# Patient Record
Sex: Male | Born: 1955 | Race: Black or African American | Hispanic: No | Marital: Single | Smoking: Former smoker
Health system: Southern US, Community
[De-identification: ages and names within clinical notes are randomized; demographics above are authoritative.]

## PROBLEM LIST (undated history)

## (undated) DIAGNOSIS — R634 Abnormal weight loss: Secondary | ICD-10-CM

## (undated) DIAGNOSIS — R059 Cough, unspecified: Secondary | ICD-10-CM

## (undated) DIAGNOSIS — R32 Unspecified urinary incontinence: Secondary | ICD-10-CM

## (undated) DIAGNOSIS — F32A Depression, unspecified: Secondary | ICD-10-CM

## (undated) DIAGNOSIS — F329 Major depressive disorder, single episode, unspecified: Secondary | ICD-10-CM

## (undated) DIAGNOSIS — F419 Anxiety disorder, unspecified: Secondary | ICD-10-CM

## (undated) DIAGNOSIS — E1165 Type 2 diabetes mellitus with hyperglycemia: Secondary | ICD-10-CM

## (undated) DIAGNOSIS — E559 Vitamin D deficiency, unspecified: Secondary | ICD-10-CM

## (undated) DIAGNOSIS — M545 Low back pain, unspecified: Secondary | ICD-10-CM

## (undated) DIAGNOSIS — R55 Syncope and collapse: Secondary | ICD-10-CM

## (undated) DIAGNOSIS — I10 Essential (primary) hypertension: Secondary | ICD-10-CM

## (undated) DIAGNOSIS — R05 Cough: Secondary | ICD-10-CM

## (undated) DIAGNOSIS — M549 Dorsalgia, unspecified: Secondary | ICD-10-CM

## (undated) DIAGNOSIS — M1990 Unspecified osteoarthritis, unspecified site: Secondary | ICD-10-CM

## (undated) DIAGNOSIS — IMO0001 Reserved for inherently not codable concepts without codable children: Secondary | ICD-10-CM

## (undated) DIAGNOSIS — E785 Hyperlipidemia, unspecified: Secondary | ICD-10-CM

## (undated) HISTORY — DX: Unspecified urinary incontinence: R32

## (undated) HISTORY — DX: Major depressive disorder, single episode, unspecified: F32.9

## (undated) HISTORY — DX: Low back pain: M54.5

## (undated) HISTORY — DX: Cough: R05

## (undated) HISTORY — DX: Vitamin D deficiency, unspecified: E55.9

## (undated) HISTORY — DX: Low back pain, unspecified: M54.50

## (undated) HISTORY — DX: Hyperlipidemia, unspecified: E78.5

## (undated) HISTORY — DX: Dorsalgia, unspecified: M54.9

## (undated) HISTORY — DX: Unspecified osteoarthritis, unspecified site: M19.90

## (undated) HISTORY — DX: Anxiety disorder, unspecified: F41.9

## (undated) HISTORY — DX: Abnormal weight loss: R63.4

## (undated) HISTORY — DX: Syncope and collapse: R55

## (undated) HISTORY — DX: Essential (primary) hypertension: I10

## (undated) HISTORY — DX: Depression, unspecified: F32.A

## (undated) HISTORY — DX: Cough, unspecified: R05.9

## (undated) HISTORY — DX: Reserved for inherently not codable concepts without codable children: IMO0001

## (undated) HISTORY — DX: Type 2 diabetes mellitus with hyperglycemia: E11.65

---

## 2010-07-17 HISTORY — PX: CERVICAL FUSION: SHX112

## 2010-07-17 HISTORY — PX: KNEE ARTHROSCOPY: SUR90

## 2010-07-17 HISTORY — PX: LUMBAR DISC SURGERY: SHX700

## 2012-09-09 ENCOUNTER — Other Ambulatory Visit: Payer: Self-pay | Admitting: Nurse Practitioner

## 2012-09-09 ENCOUNTER — Encounter: Payer: Self-pay | Admitting: Internal Medicine

## 2012-09-09 DIAGNOSIS — F17200 Nicotine dependence, unspecified, uncomplicated: Secondary | ICD-10-CM

## 2012-09-10 ENCOUNTER — Other Ambulatory Visit: Payer: Self-pay | Admitting: Nurse Practitioner

## 2012-09-10 ENCOUNTER — Ambulatory Visit
Admission: RE | Admit: 2012-09-10 | Discharge: 2012-09-10 | Disposition: A | Discharge status: Home / Self Care | Payer: Medicare Other | Source: Ambulatory Visit | Attending: Nurse Practitioner | Admitting: Nurse Practitioner

## 2012-09-10 DIAGNOSIS — R05 Cough: Secondary | ICD-10-CM

## 2012-09-10 DIAGNOSIS — R52 Pain, unspecified: Secondary | ICD-10-CM

## 2012-09-10 DIAGNOSIS — F17200 Nicotine dependence, unspecified, uncomplicated: Secondary | ICD-10-CM

## 2012-10-03 ENCOUNTER — Ambulatory Visit (AMBULATORY_SURGERY_CENTER): Payer: Medicare Other | Admitting: *Deleted

## 2012-10-03 VITALS — Ht 74.0 in | Wt 264.4 lb

## 2012-10-03 DIAGNOSIS — Z1211 Encounter for screening for malignant neoplasm of colon: Secondary | ICD-10-CM

## 2012-10-03 MED ORDER — MOVIPREP 100 G PO SOLR: ORAL | Status: DC

## 2012-10-14 ENCOUNTER — Encounter: Payer: Self-pay | Admitting: Pharmacotherapy

## 2012-10-14 ENCOUNTER — Ambulatory Visit (INDEPENDENT_AMBULATORY_CARE_PROVIDER_SITE_OTHER): Payer: Medicare Other | Admitting: Pharmacotherapy

## 2012-10-14 VITALS — BP 110/72 | HR 91 | Temp 98.1°F | Resp 18 | Ht 74.5 in | Wt 262.0 lb

## 2012-10-14 DIAGNOSIS — I10 Essential (primary) hypertension: Secondary | ICD-10-CM

## 2012-10-14 DIAGNOSIS — E119 Type 2 diabetes mellitus without complications: Secondary | ICD-10-CM

## 2012-10-14 DIAGNOSIS — E1149 Type 2 diabetes mellitus with other diabetic neurological complication: Secondary | ICD-10-CM | POA: Insufficient documentation

## 2012-10-14 NOTE — Progress Notes (Signed)
  Subjective:    James Fitzpatrick is a 57 y.o. male who presents for follow-up of Type 2 diabetes mellitus.   He has not been self-monitoring blood glucose - no lancets. He has been cutting out simple sugars and cutting portion sizes.  He has a weakness for rice and pasta.  Eats rice and pasta every day. He often skips meals. No regular exercise. He does have an active job. He does have numbness and tingling in feet - but this it from sciatic nerve.  He checks feet daily Some blurry vision.  Eye exam was last week. Some nocturia. Having some polydipsia and dry mouth  He is currently taking Metformin 1000mg  AM, 500mg  PM.  No GI distress.  Review of Systems A comprehensive review of systems was negative.    Objective:    BP 110/72  Pulse 91  Temp(Src) 98.1 F (36.7 C) (Oral)  Resp 18  Ht 6' 2.5" (1.892 m)  Wt 262 lb (118.842 kg)  BMI 33.2 kg/m2  SpO2 97%  General:  alert, cooperative and appears stated age  Oropharynx: lips, mucosa, and tongue normal; teeth and gums normal   Eyes:  conjunctivae/corneas clear. PERRL, EOM's intact. Fundi benign.           Lung: clear to auscultation bilaterally  Heart:  regular rate and rhythm, S1, S2 normal, no murmur, click, rub or gallop        Skin: warm and dry, no hyperpigmentation, vitiligo, or suspicious lesions   Lab Review No results found for this basename: GLUCOSE, SODIUM, POTASSIUM, CHLORIDE, CO2, BUN, CREATININE  A1C 6.9% in January 2014   Assessment:    Diabetes Mellitus type II, under fair control.  HTN - BP at goal <140/80   Plan:   1.  Continue Metformin 1000mg  AM, 500mg  PM 2.  Counseled on complications of uncontrolled DM 3.  Counseled on insulin resistance 4.  Counseled on meal planning, serving size and carbohydrate counting. 5.  Counseled on benefit of routine exercise.  Goal is 30-45 minutes 5 x week. 6.  Counseled on foot care. 7.  Needs to self monitor blood glucose.  RX for lancets called to [facility] for  patient. 8.  BP at goal <140/80.  Continue Lisinopril and HCTZ. 9.  RTC in 6 weeks with blood glucose meter to reassess metformin effectiveness.

## 2012-10-17 ENCOUNTER — Ambulatory Visit (AMBULATORY_SURGERY_CENTER): Payer: Medicare Other | Admitting: Internal Medicine

## 2012-10-17 ENCOUNTER — Encounter: Payer: Self-pay | Admitting: Internal Medicine

## 2012-10-17 ENCOUNTER — Other Ambulatory Visit: Payer: Self-pay | Admitting: Internal Medicine

## 2012-10-17 VITALS — BP 120/77 | HR 59 | Temp 96.0°F | Resp 20 | Ht 74.0 in | Wt 264.0 lb

## 2012-10-17 DIAGNOSIS — K635 Polyp of colon: Secondary | ICD-10-CM

## 2012-10-17 DIAGNOSIS — K633 Ulcer of intestine: Secondary | ICD-10-CM

## 2012-10-17 DIAGNOSIS — Z1211 Encounter for screening for malignant neoplasm of colon: Secondary | ICD-10-CM

## 2012-10-17 DIAGNOSIS — D1330 Benign neoplasm of unspecified part of small intestine: Secondary | ICD-10-CM

## 2012-10-17 DIAGNOSIS — K529 Noninfective gastroenteritis and colitis, unspecified: Secondary | ICD-10-CM

## 2012-10-17 DIAGNOSIS — D126 Benign neoplasm of colon, unspecified: Secondary | ICD-10-CM

## 2012-10-17 MED ORDER — SODIUM CHLORIDE 0.9 % IV SOLN: 500.0000 mL | INTRAVENOUS | Status: DC

## 2012-10-17 NOTE — Patient Instructions (Addendum)

## 2012-10-17 NOTE — Progress Notes (Signed)
Lidocaine-40mg IV prior to Propofol InductionPropofol given over incremental dosages 

## 2012-10-17 NOTE — Progress Notes (Signed)
Called to room to assist during endoscopic procedure.  Patient ID and intended procedure confirmed with present staff. Received instructions for my participation in the procedure from the performing physician.  

## 2012-10-17 NOTE — Op Note (Signed)
[facility] [address] [place], [address]   COLONOSCOPY PROCEDURE REPORT  PATIENT: James Fitzpatrick, James Fitzpatrick  MR#: [id number] BIRTHDATE: 1956/02/19 , 56  yrs. old GENDER: Male ENDOSCOPIST: Beverley Fiedler, MD REFERRED BY: Candelaria Celeste, [facility] PROCEDURE DATE:  10/17/2012 PROCEDURE:   Colonoscopy with biopsy and Colonoscopy with snare polypectomy ASA CLASS:   Class II INDICATIONS:elevated risk screening, Patient's immediate family history of colon cancer, and Last colonoscopy performed greater than 5 yrs ago. MEDICATIONS: MAC sedation, administered by CRNA and propofol (Diprivan) 250mg  IV  DESCRIPTION OF PROCEDURE:   After the risks benefits and alternatives of the procedure were thoroughly explained, informed consent was obtained.  A digital rectal exam revealed no rectal mass.   The LB CF-[id number]  endoscope was introduced through the anus and advanced to the terminal ileum which was intubated for a short distance. No adverse events experienced.   The quality of the prep was good, using MoviPrep  The instrument was then slowly withdrawn as the colon was fully examined.   COLON FINDINGS: Two small ulcers without surrounding ileitis were found in the terminal ileum.  Multiple biopsies were performed using cold forceps.   Two sessile polyps ranging between 3-58mm in size were found in the rectosigmoid colon.  Polypectomy was performed using cold snare.  All resections were complete and all polyp tissue was completely retrieved.   Mild diverticulosis was noted in the descending colon and sigmoid colon.   The colon mucosa was otherwise normal.  Retroflexed views revealed no abnormalities. The time to cecum=3 minutes 55 seconds.  Withdrawal time=13 minutes 42 seconds.  The scope was withdrawn and the procedure completed. COMPLICATIONS: There were no complications.  ENDOSCOPIC IMPRESSION: 1.   Two small ulcers in the terminal ileum; multiple biopsies were performed  using cold forceps 2.   Two sessile polyps ranging between 3-49mm in size were found in the rectosigmoid colon; Polypectomy was performed using cold snare 3.   Mild diverticulosis was noted in the descending colon and sigmoid colon 4.   The colon mucosa was otherwise normal  RECOMMENDATIONS: 1.  Await pathology results 2.  High fiber diet 3.  Avoid NSAIDS 4.  Given your significant family history of colon cancer, you should have a repeat colonoscopy in 5 years   eSigned:  Beverley Fiedler, MD 10/17/2012 12:58 PM           cc: The Patient

## 2012-10-17 NOTE — Progress Notes (Signed)
Patient did not experience any of the following events: a burn prior to discharge; a fall within the facility; wrong site/side/patient/procedure/implant event; or a hospital transfer or hospital admission upon discharge from the facility. (G8907) Patient did not have preoperative order for IV antibiotic SSI prophylaxis. (G8918)  

## 2012-10-18 ENCOUNTER — Telehealth: Payer: Self-pay | Admitting: *Deleted

## 2012-10-18 NOTE — Telephone Encounter (Signed)
  Follow up Call-  Call back number 10/17/2012  Post procedure Call Back phone  # [phone]  Permission to leave phone message Yes     Patient questions:  Do you have a fever, pain , or abdominal swelling? no Pain Score  0 *  Have you tolerated food without any problems? yes  Have you been able to return to your normal activities? yes  Do you have any questions about your discharge instructions: Diet   no Medications  no Follow up visit  no  Do you have questions or concerns about your Care? no  Actions: * If pain score is 4 or above: No action needed, pain <4.

## 2012-10-23 ENCOUNTER — Encounter: Payer: Self-pay | Admitting: Internal Medicine

## 2012-11-13 ENCOUNTER — Encounter: Payer: Self-pay | Admitting: *Deleted

## 2012-11-14 ENCOUNTER — Encounter: Payer: Self-pay | Admitting: Nurse Practitioner

## 2012-11-14 ENCOUNTER — Ambulatory Visit (INDEPENDENT_AMBULATORY_CARE_PROVIDER_SITE_OTHER): Payer: Medicare Other | Admitting: Nurse Practitioner

## 2012-11-14 VITALS — BP 134/88 | HR 71 | Temp 97.4°F | Resp 15 | Ht 74.0 in | Wt 263.2 lb

## 2012-11-14 DIAGNOSIS — E1142 Type 2 diabetes mellitus with diabetic polyneuropathy: Secondary | ICD-10-CM

## 2012-11-14 DIAGNOSIS — M5430 Sciatica, unspecified side: Secondary | ICD-10-CM | POA: Insufficient documentation

## 2012-11-14 DIAGNOSIS — L98499 Non-pressure chronic ulcer of skin of other sites with unspecified severity: Secondary | ICD-10-CM

## 2012-11-14 DIAGNOSIS — M542 Cervicalgia: Secondary | ICD-10-CM

## 2012-11-14 DIAGNOSIS — E1140 Type 2 diabetes mellitus with diabetic neuropathy, unspecified: Secondary | ICD-10-CM | POA: Insufficient documentation

## 2012-11-14 DIAGNOSIS — M5431 Sciatica, right side: Secondary | ICD-10-CM

## 2012-11-14 DIAGNOSIS — E1149 Type 2 diabetes mellitus with other diabetic neurological complication: Secondary | ICD-10-CM

## 2012-11-14 MED ORDER — CYCLOBENZAPRINE HCL 5 MG PO TABS: 5.0000 mg | ORAL_TABLET | Freq: Three times a day (TID) | ORAL | Status: DC | PRN

## 2012-11-14 MED ORDER — CYCLOBENZAPRINE HCL 10 MG PO TABS: 10.0000 mg | ORAL_TABLET | Freq: Three times a day (TID) | ORAL | Status: DC | PRN

## 2012-11-14 NOTE — Assessment & Plan Note (Signed)
Mostly likely muscular related. Will give Rx for flexeril which has worked well for him in the past; educated on using heat and to take aleve TID for 7 days

## 2012-11-14 NOTE — Assessment & Plan Note (Signed)
Stable at this time 

## 2012-11-14 NOTE — Progress Notes (Signed)
Patient ID: James Fitzpatrick, male   DOB: 1955-07-30, 57 y.o.   MRN: [id number]  No Known Allergies  Chief Complaint  Patient presents with  . Back Pain  . Neck Pain    HPI: Patient is a 57 y.o. male seen in the office today for worsening back and neck pain. Pt was doing overtime at work and reports he over did it. Uncomfortable at night and very stiff in the morning. Pain on left side of neck and lower back bilaterally and shooting pains down his legs. Have to rest while at work due to the pain. Has not taken any medication for this   Also reports increased pain in his left index finger. He has a painful raised bump and opened the area up over a month ago. No drainage or heat but occasionally will get swollen and is always painful.  Review of Systems:  Review of Systems  Constitutional: Negative for fever, chills and weight loss.  HENT: Positive for neck pain.   Respiratory: Negative for shortness of breath.   Cardiovascular: Negative for chest pain.  Gastrointestinal: Negative for heartburn, nausea, vomiting, abdominal pain, diarrhea and constipation.  Genitourinary: Negative for dysuria, urgency and frequency.  Musculoskeletal: Positive for myalgias and back pain.  Skin: Negative for itching and rash.  Neurological: Negative for dizziness, weakness and headaches.     Past Medical History  Diagnosis Date  . Diabetes   . Hypertension   . Depression   . Arthritis   . Hyperlipidemia   . Pain in back   . Unspecified vitamin D deficiency   . Anxiety   . Syncope and collapse   . Cough   . Unspecified urinary incontinence    Past Surgical History  Procedure Laterality Date  . Lumbar disc surgery  2012  . Cervical fusion  2012  . Knee arthroscopy  2012    left   Social History:   reports that he has been smoking Cigarettes.  He has been smoking about 1.00 pack per day. He has never used smokeless tobacco. He reports that he does not drink alcohol or use illicit  drugs.  Family History  Problem Relation Age of Onset  . Colon cancer Father 68    Medications: Patient's Medications  New Prescriptions   CYCLOBENZAPRINE (FLEXERIL) 10 MG TABLET    Take 1 tablet (10 mg total) by mouth 3 (three) times daily as needed for muscle spasms.  Previous Medications   ACETAMINOPHEN-CODEINE (TYLENOL #3) 300-30 MG PER TABLET    Take 1 tablet by mouth every 4 (four) hours as needed for pain.   CITALOPRAM (CELEXA) 20 MG TABLET    Take 20 mg by mouth daily.   HYDROCHLOROTHIAZIDE (HYDRODIURIL) 25 MG TABLET    Take 25 mg by mouth daily.   LISINOPRIL (PRINIVIL,ZESTRIL) 20 MG TABLET    Take 20 mg by mouth daily.   METFORMIN HCL PO    Take by mouth. Takes 1000 mg in morning and 500 mg in the evening   MULTIPLE VITAMINS-MINERALS (MULTIVITAMIN PO)    Take by mouth daily.  Modified Medications   No medications on file  Discontinued Medications   MOVIPREP 100 G SOLR         Physical Exam:  Filed Vitals:   11/14/12 0903  BP: 134/88  Pulse: 71  Temp: 97.4 F (36.3 C)  TempSrc: Oral  Resp: 15  Height: 6\' 2"  (1.88 m)  Weight: 263 lb 3.2 oz (119.387 kg)    Physical Exam  Constitutional: He is well-developed, well-nourished, and in no distress. No distress.  HENT:  Head: Normocephalic and atraumatic.  Eyes: Conjunctivae and EOM are normal. Pupils are equal, round, and reactive to light.  Neck: Normal range of motion. Neck supple.  Cardiovascular: Normal rate, regular rhythm and normal heart sounds.   Pulmonary/Chest: Effort normal and breath sounds normal.  Abdominal: Soft. Bowel sounds are normal.  Musculoskeletal: He exhibits no edema.       Cervical back: He exhibits tenderness. He exhibits no bony tenderness, no swelling and no edema.  Tender along the left side of the cervical spine- tender to pull arms up above head but able to move all extremities without limited ROM  Neurological: He is alert.  Reports decrease sensation in lower extremities to touch    Skin: Skin is warm and dry. He is not diaphoretic.    Assessment/Plan Type II or unspecified type diabetes mellitus with neurological manifestations, not stated as uncontrolled(250.60) Will check labs today- reports he is only taking metformin 1000mg  in the am- may need to go up on this if A1c is worse.   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 Will give Rx for flexeril which has worked well for him in the past; educated on using heat and to take aleve TID for 7 days   Sciatica Will treat with flexeril, heat and aleve to follow up if no improvement or pain getting worse   ulceration of finger- due to area being dark in color painful and being on the hand will send to wound care for evaluation and treatment    Labs/tests ordered  Aic, BMp

## 2012-11-14 NOTE — Assessment & Plan Note (Signed)
Will treat with flexeril, heat and aleve to follow up if no improvement or pain getting worse

## 2012-11-14 NOTE — Assessment & Plan Note (Signed)
Will check labs today- reports he is only taking metformin 1000mg  in the am- may need to go up on this if A1c is worse.

## 2012-11-14 NOTE — Patient Instructions (Addendum)
Take aleve three time daily for 1 week Use heating pad to effected areas  Will send to wound care to evaluate finger  Take ranitidine 150mg  twice daily (OTC)   Sciatica Sciatica is pain, weakness, numbness, or tingling along the path of the sciatic nerve. The nerve starts in the lower back and runs down the back of each leg. The nerve controls the muscles in the lower leg and in the back of the knee, while also providing sensation to the back of the thigh, lower leg, and the sole of your foot. Sciatica is a symptom of another medical condition. For instance, nerve damage or certain conditions, such as a herniated disk or bone spur on the spine, pinch or put pressure on the sciatic nerve. This causes the pain, weakness, or other sensations normally associated with sciatica. Generally, sciatica only affects one side of the body. CAUSES   Herniated or slipped disc.  Degenerative disk disease.  A pain disorder involving the narrow muscle in the buttocks (piriformis syndrome).  Pelvic injury or fracture.  Pregnancy.  Tumor (rare). SYMPTOMS  Symptoms can vary from mild to very severe. The symptoms usually travel from the low back to the buttocks and down the back of the leg. Symptoms can include:  Mild tingling or dull aches in the lower back, leg, or hip.  Numbness in the back of the calf or sole of the foot.  Burning sensations in the lower back, leg, or hip.  Sharp pains in the lower back, leg, or hip.  Leg weakness.  Severe back pain inhibiting movement. These symptoms may get worse with coughing, sneezing, laughing, or prolonged sitting or standing. Also, being overweight may worsen symptoms. DIAGNOSIS  Your caregiver will perform a physical exam to look for common symptoms of sciatica. He or she may ask you to do certain movements or activities that would trigger sciatic nerve pain. Other tests may be performed to find the cause of the sciatica. These may include:  Blood  tests.  X-rays.  Imaging tests, such as an MRI or CT scan. TREATMENT  Treatment is directed at the cause of the sciatic pain. Sometimes, treatment is not necessary and the pain and discomfort goes away on its own. If treatment is needed, your caregiver may suggest:  Over-the-counter medicines to relieve pain.  Prescription medicines, such as anti-inflammatory medicine, muscle relaxants, or narcotics.  Applying heat or ice to the painful area.  Steroid injections to lessen pain, irritation, and inflammation around the nerve.  Reducing activity during periods of pain.  Exercising and stretching to strengthen your abdomen and improve flexibility of your spine. Your caregiver may suggest losing weight if the extra weight makes the back pain worse.  Physical therapy.  Surgery to eliminate what is pressing or pinching the nerve, such as a bone spur or part of a herniated disk. HOME CARE INSTRUCTIONS   Only take over-the-counter or prescription medicines for pain or discomfort as directed by your caregiver.  Apply ice to the affected area for 20 minutes, 3 4 times a day for the first 48 72 hours. Then try heat in the same way.  Exercise, stretch, or perform your usual activities if these do not aggravate your pain.  Attend physical therapy sessions as directed by your caregiver.  Keep all follow-up appointments as directed by your caregiver.  Do not wear high heels or shoes that do not provide proper support.  Check your mattress to see if it is too soft. A firm  mattress may lessen your pain and discomfort. SEEK IMMEDIATE MEDICAL CARE IF:   You lose control of your bowel or bladder (incontinence).  You have increasing weakness in the lower back, pelvis, buttocks, or legs.  You have redness or swelling of your back.  You have a burning sensation when you urinate.  You have pain that gets worse when you lie down or awakens you at night.  Your pain is worse than you have  experienced in the past.  Your pain is lasting longer than 4 weeks.  You are suddenly losing weight without reason. MAKE SURE YOU:  Understand these instructions.  Will watch your condition.  Will get help right away if you are not doing well or get worse. Document Released: 06/27/2001 Document Revised: 01/02/2012 Document Reviewed: 11/12/2011 [facility] Patient Information 2013 [place], [place].

## 2012-11-15 LAB — BASIC METABOLIC PANEL
BUN: 15 mg/dL (ref 6–24)
CO2: 24 mmol/L (ref 19–28)
Calcium: 10.5 mg/dL — ABNORMAL HIGH (ref 8.7–10.2)
Creatinine, Ser: 1.29 mg/dL — ABNORMAL HIGH (ref 0.76–1.27)
GFR calc non Af Amer: 62 mL/min/{1.73_m2} (ref >59)
Glucose: 105 mg/dL — ABNORMAL HIGH (ref 65–99)
Sodium: 136 mmol/L (ref 134–144)

## 2012-11-15 LAB — HEMOGLOBIN A1C: Hgb A1c MFr Bld: 7 % — ABNORMAL HIGH (ref 4.8–5.6)

## 2012-12-02 ENCOUNTER — Encounter (HOSPITAL_BASED_OUTPATIENT_CLINIC_OR_DEPARTMENT_OTHER): Payer: Medicare Other | Attending: Plastic Surgery

## 2012-12-02 ENCOUNTER — Other Ambulatory Visit: Payer: Self-pay | Admitting: Plastic Surgery

## 2012-12-02 DIAGNOSIS — M129 Arthropathy, unspecified: Secondary | ICD-10-CM | POA: Insufficient documentation

## 2012-12-02 DIAGNOSIS — R229 Localized swelling, mass and lump, unspecified: Secondary | ICD-10-CM | POA: Insufficient documentation

## 2012-12-02 DIAGNOSIS — L608 Other nail disorders: Secondary | ICD-10-CM | POA: Insufficient documentation

## 2012-12-02 DIAGNOSIS — B079 Viral wart, unspecified: Secondary | ICD-10-CM | POA: Insufficient documentation

## 2012-12-02 DIAGNOSIS — E785 Hyperlipidemia, unspecified: Secondary | ICD-10-CM | POA: Insufficient documentation

## 2012-12-02 DIAGNOSIS — I10 Essential (primary) hypertension: Secondary | ICD-10-CM | POA: Insufficient documentation

## 2012-12-02 DIAGNOSIS — E119 Type 2 diabetes mellitus without complications: Secondary | ICD-10-CM | POA: Insufficient documentation

## 2012-12-03 ENCOUNTER — Encounter: Payer: Self-pay | Admitting: *Deleted

## 2012-12-03 NOTE — Progress Notes (Signed)
[facility]  NAME:  James Fitzpatrick, James Fitzpatrick              ACCOUNT NO.:  [id number]  MEDICAL RECORD NO.:  [id number]      DATE OF BIRTH:  1955/09/22  PHYSICIAN:  Wayland Denis, DO       VISIT DATE:  12/02/2012                                  OFFICE VISIT   HISTORY OF PRESENT ILLNESS:  The patient is a 57 year old gentleman, who is here for evaluation of his right index finger.  He states that he has had wound in the area without it improving over the past 6 months.  He is not sure what happened and why he got it.  He has no associated events.  He has poked and fiddled  with it himself, which likely did not help.  Otherwise, nothing seems to be making it better.  PAST MEDICAL HISTORY:  Diabetes, hypertension, depression, arthritis, hyperlipidemia, back pain, vitamin D deficiency, anxiety, syncope, cough, and urinary incontinence.  PAST SURGICAL HISTORY:  Left lumbar disk surgery, cervical fusion, and knee arthroscopy of the three surgical procedures he has had.  SOCIAL HISTORY:  He is a smoker about a pack a day.  He denies any alcohol or illicit drugs.  He lives at home.  FAMILY HISTORY:  Positive for colon cancer.  REVIEW OF SYSTEMS:  He denies any was significant change in his vision. He has trouble keeping his blood sugars under control.  He denies any shortness of breath, chest pain, blood in his urine.  Skin; several skin issues and the left wrist nodule on the dorsal aspect.  PHYSICAL EXAMINATION:  VITAL SIGNS:  He is alert, oriented, cooperative, not in any acute distress.  He is pleasant. HEENT:  His pupils are equal.  Extraocular muscles are intact. NECK:  No cervical lymphadenopathy. LUNGS:  His breathing is unlabored. HEART:  His pulses are strong.  ASSESSMENT AND PLAN:  He has discoloration on the nail bed from proximal to distal in the central portion of the right long finger.  This is concerning for a melanoma.  He has got good capillary refill and  his fingers are warm.  The index finger on the palmar aspect has a 3-4 mm opening in the skin with some discoloration, which is concerning for melanoma as well.  On the left wrist, he has a 2-cm nodule that looks like a ganglion cyst that has been growing in the past 6 months.  All of these are concerning, he will be referred to Dr. Teressa Senter for the left ganglion cyst and the right long finger discoloration for which a biopsy is recommended.  A biopsy was done of the index finger, 3 mm with a ring block under sterile technique.  Superficial and deep margins were sent. The patient is to use triple antibiotic ointment and keep his arm elevated and follow up in 2 weeks.     Wayland Denis, DO     CS/MEDQ  D:  12/02/2012  T:  12/03/2012  Job:  [id number]

## 2012-12-05 ENCOUNTER — Ambulatory Visit: Payer: Self-pay | Admitting: Nurse Practitioner

## 2012-12-12 ENCOUNTER — Ambulatory Visit: Payer: Medicare Other | Admitting: Nurse Practitioner

## 2012-12-20 ENCOUNTER — Encounter: Payer: Self-pay | Admitting: *Deleted

## 2012-12-23 ENCOUNTER — Ambulatory Visit (INDEPENDENT_AMBULATORY_CARE_PROVIDER_SITE_OTHER): Payer: Medicare Other | Admitting: Nurse Practitioner

## 2012-12-23 ENCOUNTER — Encounter (HOSPITAL_BASED_OUTPATIENT_CLINIC_OR_DEPARTMENT_OTHER): Payer: Medicare Other | Attending: Plastic Surgery

## 2012-12-23 ENCOUNTER — Encounter: Payer: Self-pay | Admitting: Nurse Practitioner

## 2012-12-23 VITALS — BP 128/86 | HR 100 | Temp 98.1°F | Resp 15 | Ht 74.0 in | Wt 265.4 lb

## 2012-12-23 DIAGNOSIS — M5430 Sciatica, unspecified side: Secondary | ICD-10-CM

## 2012-12-23 DIAGNOSIS — E1142 Type 2 diabetes mellitus with diabetic polyneuropathy: Secondary | ICD-10-CM

## 2012-12-23 DIAGNOSIS — L98499 Non-pressure chronic ulcer of skin of other sites with unspecified severity: Secondary | ICD-10-CM

## 2012-12-23 DIAGNOSIS — E1140 Type 2 diabetes mellitus with diabetic neuropathy, unspecified: Secondary | ICD-10-CM

## 2012-12-23 DIAGNOSIS — M5431 Sciatica, right side: Secondary | ICD-10-CM

## 2012-12-23 DIAGNOSIS — E785 Hyperlipidemia, unspecified: Secondary | ICD-10-CM

## 2012-12-23 DIAGNOSIS — E1149 Type 2 diabetes mellitus with other diabetic neurological complication: Secondary | ICD-10-CM

## 2012-12-23 MED ORDER — GABAPENTIN 100 MG PO CAPS: 100.0000 mg | ORAL_CAPSULE | Freq: Four times a day (QID) | ORAL | Status: AC

## 2012-12-23 MED ORDER — METFORMIN HCL 1000 MG PO TABS: 1000.0000 mg | ORAL_TABLET | Freq: Two times a day (BID) | ORAL | Status: AC

## 2012-12-23 MED ORDER — CYCLOBENZAPRINE HCL 10 MG PO TABS: 10.0000 mg | ORAL_TABLET | Freq: Three times a day (TID) | ORAL | Status: DC | PRN

## 2012-12-23 NOTE — Patient Instructions (Addendum)
Please make appt in 2 months with Dr Glade Lloyd Mr Tagliaferro will need to come in for Lab work before appt

## 2012-12-23 NOTE — Progress Notes (Signed)
Patient ID: James Fitzpatrick, male   DOB: 08/22/1955, 57 y.o.   MRN: [id number]   No Known Allergies  Chief Complaint  Patient presents with  . Medical Managment of Chronic Issues    HPI: Patient is a 57 y.o. male seen in the office today for follow up Taking flexeril which significantly helps pain but is only taking this at night after he has worked all day- he has now gone from part time to full time at work and now neuropathy is worse  Ulcer on finger still there- does not notice it as much now- has follow up with wound care center  Review of Systems:  Review of Systems  Constitutional: Negative for fever, chills and malaise/fatigue.  Respiratory: Negative for cough.   Cardiovascular: Negative for chest pain, palpitations and leg swelling.  Gastrointestinal: Negative for abdominal pain, diarrhea and constipation.  Genitourinary: Negative for dysuria, urgency and frequency.  Musculoskeletal: Positive for back pain.  Skin: Negative.   Neurological: Positive for tingling (neuropathy in legs worse). Negative for weakness and headaches.  Psychiatric/Behavioral: Negative for depression. The patient is not nervous/anxious and does not have insomnia.      Past Medical History  Diagnosis Date  . Type II or unspecified type diabetes mellitus without mention of complication, uncontrolled   . Hypertension   . Depression   . Arthritis   . Hyperlipidemia   . Pain in back   . Unspecified vitamin D deficiency   . Anxiety   . Syncope and collapse   . Cough   . Unspecified urinary incontinence   . Loss of weight   . Unspecified vitamin D deficiency   . Major depressive disorder, single episode, unspecified   . Lumbago   . Loss of weight   . Type II or unspecified type diabetes mellitus without mention of complication, not stated as uncontrolled    Past Surgical History  Procedure Laterality Date  . Lumbar disc surgery  2012  . Cervical fusion  2012  . Knee arthroscopy  2012    left    Social History:   reports that he has been smoking Cigarettes.  He has been smoking about 1.00 pack per day. He has never used smokeless tobacco. He reports that he does not drink alcohol or use illicit drugs.  Family History  Problem Relation Age of Onset  . Colon cancer Father 44  . Stroke Mother     Medications: Patient's Medications  New Prescriptions   No medications on file  Previous Medications   ACETAMINOPHEN-CODEINE (TYLENOL #3) 300-30 MG PER TABLET    Take 1 tablet by mouth every 4 (four) hours as needed for pain.   CITALOPRAM (CELEXA) 20 MG TABLET    Take 20 mg by mouth daily.   CYCLOBENZAPRINE (FLEXERIL) 10 MG TABLET    Take 10 mg by mouth 3 (three) times daily as needed for muscle spasms.   ERGOCALCIFEROL (VITAMIN D2) 50000 UNITS CAPSULE    Take 50,000 Units by mouth once a week. Take one tablet once a week for vitamin d supplement   GLUCOSE BLOOD TEST STRIP    1 each by Other route as needed for other. Use to check blood sugar twice a day Dx. 250.02   HYDROCHLOROTHIAZIDE (HYDRODIURIL) 25 MG TABLET    Take 25 mg by mouth daily.   LISINOPRIL (PRINIVIL,ZESTRIL) 10 MG TABLET    Take 10 mg by mouth daily. Take one tablet once a day for blood pressure   MULTIPLE VITAMINS-MINERALS (  CENTRUM SILVER PO)    Take by mouth. Take one tablet once a day  Modified Medications   No medications on file  Discontinued Medications   METFORMIN HCL PO    Take by mouth. Takes 1000 mg in morning and 500 mg in the evening     Physical Exam:  Filed Vitals:   12/23/12 1524  BP: 128/86  Pulse: 100  Temp: 98.1 F (36.7 C)  TempSrc: Oral  Resp: 15  Height: 6\' 2"  (1.88 m)  Weight: 265 lb 6.4 oz (120.385 kg)    Physical Exam  Constitutional: He is oriented to person, place, and time and well-developed, well-nourished, and in no distress. No distress.  Cardiovascular: Normal rate and normal heart sounds.   Pulmonary/Chest: Effort normal and breath sounds normal. No respiratory distress.   Abdominal: Soft. Bowel sounds are normal. He exhibits no distension. There is no tenderness.  Musculoskeletal: Normal range of motion. He exhibits no edema and no tenderness.  Neurological: He is alert and oriented to person, place, and time.  Skin: Skin is warm and dry. He is not diaphoretic.  Psychiatric: Affect normal.     Labs reviewed: Basic Metabolic Panel:  Recent Labs  91/47/82 1018  NA 136  K 4.4  CL 97  CO2 24  GLUCOSE 105*  BUN 15  CREATININE 1.29*  CALCIUM 10.5*    Assessment/Plan No problem-specific assessment & plan notes found for this encounter.    1.   Ulcer of finger, with unspecified severity 707.8     conts to follow up with wound care   2.   Type II or unspecified type diabetes mellitus with neurological manifestations, not stated as uncontrolled(250.60) 250.60     Will get AIC and CMP   3.   Sciatica, right 724.3     stable; continue current regimen.   4.   Neuropathy in diabetes 250.60 ...     Will add gabapentin- to take taking at bedtime and then may increase to three times daily  Also if needed may take 2 tablets at night to help with neuropathy    5.   Other and unspecified hyperlipidemia   Will follow up lipids

## 2012-12-24 ENCOUNTER — Other Ambulatory Visit: Payer: Medicare Other

## 2012-12-25 LAB — COMPREHENSIVE METABOLIC PANEL
ALT: 29 IU/L (ref 0–44)
AST: 25 IU/L (ref 0–40)
Alkaline Phosphatase: 72 IU/L (ref 39–117)
BUN/Creatinine Ratio: 8 — ABNORMAL LOW (ref 9–20)
Chloride: 100 mmol/L (ref 97–108)
GFR calc Af Amer: 70 mL/min/{1.73_m2} (ref >59)
Potassium: 3.9 mmol/L (ref 3.5–5.2)
Sodium: 139 mmol/L (ref 134–144)
Total Bilirubin: 0.5 mg/dL (ref 0.0–1.2)

## 2012-12-25 LAB — LIPID PANEL
Cholesterol, Total: 220 mg/dL — ABNORMAL HIGH (ref 100–199)
HDL: 43 mg/dL (ref >39)
LDL Calculated: 158 mg/dL — ABNORMAL HIGH (ref 0–99)
VLDL Cholesterol Cal: 19 mg/dL (ref 5–40)

## 2013-01-31 ENCOUNTER — Telehealth: Payer: Self-pay | Admitting: Geriatric Medicine

## 2013-01-31 NOTE — Telephone Encounter (Signed)
Patient says the Gabapentin is not working for his back pain. Can he try something else? Please advise, thanks.

## 2013-01-31 NOTE — Telephone Encounter (Signed)
May increase to 2 tablets 3 times daily- if this is not effective may increase to 3 tablets 3 times daily-- will need follow up if this is not effective

## 2013-01-31 NOTE — Telephone Encounter (Signed)
Patient is aware. He will increase his dosage and call me back in a few days to let me know how he is feeling. I will refill his medication accordingly. He will run out due to the increase.

## 2013-02-24 ENCOUNTER — Other Ambulatory Visit: Payer: Medicare Other

## 2013-02-26 ENCOUNTER — Ambulatory Visit: Payer: Medicare Other | Admitting: Internal Medicine

## 2013-02-26 DIAGNOSIS — Z0289 Encounter for other administrative examinations: Secondary | ICD-10-CM

## 2013-04-02 ENCOUNTER — Encounter: Payer: Self-pay | Admitting: Nurse Practitioner

## 2013-04-02 ENCOUNTER — Ambulatory Visit (INDEPENDENT_AMBULATORY_CARE_PROVIDER_SITE_OTHER): Payer: Medicare Other | Admitting: Nurse Practitioner

## 2013-04-02 VITALS — BP 136/90 | HR 103 | Wt 269.4 lb

## 2013-04-02 DIAGNOSIS — I10 Essential (primary) hypertension: Secondary | ICD-10-CM

## 2013-04-02 DIAGNOSIS — E785 Hyperlipidemia, unspecified: Secondary | ICD-10-CM

## 2013-04-02 DIAGNOSIS — M545 Low back pain, unspecified: Secondary | ICD-10-CM

## 2013-04-02 DIAGNOSIS — M5430 Sciatica, unspecified side: Secondary | ICD-10-CM

## 2013-04-02 DIAGNOSIS — E1149 Type 2 diabetes mellitus with other diabetic neurological complication: Secondary | ICD-10-CM

## 2013-04-02 DIAGNOSIS — F411 Generalized anxiety disorder: Secondary | ICD-10-CM | POA: Insufficient documentation

## 2013-04-02 DIAGNOSIS — Z23 Encounter for immunization: Secondary | ICD-10-CM

## 2013-04-02 DIAGNOSIS — M5431 Sciatica, right side: Secondary | ICD-10-CM

## 2013-04-02 MED ORDER — LISINOPRIL 10 MG PO TABS: 10.0000 mg | ORAL_TABLET | Freq: Every day | ORAL | Status: AC

## 2013-04-02 MED ORDER — HYDROCHLOROTHIAZIDE 25 MG PO TABS: 25.0000 mg | ORAL_TABLET | Freq: Every day | ORAL | Status: AC

## 2013-04-02 MED ORDER — CYCLOBENZAPRINE HCL 10 MG PO TABS: 10.0000 mg | ORAL_TABLET | Freq: Three times a day (TID) | ORAL | Status: DC | PRN

## 2013-04-02 MED ORDER — ERGOCALCIFEROL 1.25 MG (50000 UT) PO CAPS: 50000.0000 [IU] | ORAL_CAPSULE | ORAL | Status: AC

## 2013-04-02 NOTE — Patient Instructions (Addendum)
Follow up in 6 weeks with fasting lab work before visit

## 2013-04-02 NOTE — Progress Notes (Signed)
Patient ID: James Fitzpatrick, male   DOB: 1956-03-05, 57 y.o.   MRN: [id number]   No Known Allergies  Chief Complaint  Patient presents with  . Back Problem    muscle spasms since Saturday, pain henders movement at times. Patient with history of back surgery   . Medication Management    out of all medications x 2 months EXCEPT metformin     HPI: Patient is a 57 y.o. male seen in the office today for back pain; pt has been off all medication except metformin for 2 months due to laps in insurance; he is here today to request refills; does not feel like this is new pain but worsening pt due to not having his medications; Last night he could not get out of bed due to medications-- previously on flexeril which helped  Denies loss of bowel or bladder, no shortness of breath, no chest pains Has excerebration in his sciatica  Review of Systems:  Review of Systems  Constitutional: Negative for fever, chills and malaise/fatigue.  Respiratory: Negative for cough and shortness of breath.   Cardiovascular: Negative for chest pain, palpitations and leg swelling.  Gastrointestinal: Negative for abdominal pain, diarrhea and constipation.  Genitourinary: Negative for dysuria, urgency and frequency.  Musculoskeletal: Positive for myalgias, back pain and joint pain.  Neurological: Positive for tingling (down right leg) and weakness (due to pain). Negative for dizziness, sensory change, focal weakness and headaches.     Past Medical History  Diagnosis Date  . Type II or unspecified type diabetes mellitus without mention of complication, uncontrolled   . Hypertension   . Depression   . Arthritis   . Hyperlipidemia   . Pain in back   . Unspecified vitamin D deficiency   . Anxiety   . Syncope and collapse   . Cough   . Unspecified urinary incontinence   . Loss of weight   . Unspecified vitamin D deficiency   . Major depressive disorder, single episode, unspecified   . Lumbago   . Loss of weight    . Type II or unspecified type diabetes mellitus without mention of complication, not stated as uncontrolled    Past Surgical History  Procedure Laterality Date  . Lumbar disc surgery  2012  . Cervical fusion  2012  . Knee arthroscopy  2012    left   Social History:   reports that he has been smoking Cigarettes.  He has been smoking about 1.00 pack per day. He has never used smokeless tobacco. He reports that he does not drink alcohol or use illicit drugs.  Family History  Problem Relation Age of Onset  . Colon cancer Father 62  . Stroke Mother     Medications: Patient's Medications  New Prescriptions   No medications on file  Previous Medications   ACETAMINOPHEN-CODEINE (TYLENOL #3) 300-30 MG PER TABLET    Take 1 tablet by mouth every 4 (four) hours as needed for pain.   CITALOPRAM (CELEXA) 20 MG TABLET    Take 20 mg by mouth daily.   CYCLOBENZAPRINE (FLEXERIL) 10 MG TABLET    Take 1 tablet (10 mg total) by mouth 3 (three) times daily as needed for muscle spasms.   ERGOCALCIFEROL (VITAMIN D2) 50000 UNITS CAPSULE    Take 50,000 Units by mouth once a week. Take one tablet once a week for vitamin d supplement   GABAPENTIN (NEURONTIN) 100 MG CAPSULE    Take 1 capsule (100 mg total) by mouth 4 (four) times  daily.   GLUCOSE BLOOD TEST STRIP    1 each by Other route as needed for other. Use to check blood sugar twice a day Dx. 250.02   HYDROCHLOROTHIAZIDE (HYDRODIURIL) 25 MG TABLET    Take 25 mg by mouth daily.   LISINOPRIL (PRINIVIL,ZESTRIL) 10 MG TABLET    Take 10 mg by mouth daily. Take one tablet once a day for blood pressure   METFORMIN (GLUCOPHAGE) 1000 MG TABLET    Take 1 tablet (1,000 mg total) by mouth 2 (two) times daily with a meal.   MULTIPLE VITAMINS-MINERALS (CENTRUM SILVER PO)    Take by mouth. Take one tablet once a day  Modified Medications   No medications on file  Discontinued Medications   No medications on file     Physical Exam:  Filed Vitals:   04/02/13  1634  BP: 136/90  Pulse: 103  Weight: 269 lb 6.4 oz (122.199 kg)  SpO2: 96%    Physical Exam  Constitutional: He is oriented to person, place, and time and well-developed, well-nourished, and in no distress. No distress.  Cardiovascular: Normal rate, regular rhythm and normal heart sounds.   Pulmonary/Chest: Effort normal and breath sounds normal. No respiratory distress. He has no wheezes. He has no rales.  Abdominal: Soft. Bowel sounds are normal. He exhibits no distension.  Musculoskeletal: He exhibits tenderness. He exhibits no edema.  Right sided paraspinal muscle pain  Neurological: He is alert and oriented to person, place, and time.  Cautious gait due to pain  Skin: Skin is warm and dry. He is not diaphoretic.     Labs reviewed: Basic Metabolic Panel:  Recent Labs  78/29/56 1018 12/24/12 1536  NA 136 139  K 4.4 3.9  CL 97 100  CO2 24 23  GLUCOSE 105* 113*  BUN 15 11  CREATININE 1.29* 1.33*  CALCIUM 10.5* 10.5*   Liver Function Tests:  Recent Labs  12/24/12 1536  AST 25  ALT 29  ALKPHOS 72  BILITOT 0.5  PROT 7.6   No results found for this basename: LIPASE, AMYLASE,  in the last 8760 hours No results found for this basename: AMMONIA,  in the last 8760 hours CBC: No results found for this basename: WBC, NEUTROABS, HGB, HCT, MCV, PLT,  in the last 8760 hours Lipid Panel:  Recent Labs  12/24/12 1536  HDL 43  LDLCALC 158*  TRIG 94  CHOLHDL 5.1*    Assessment/Plan 1. Sciatica, right Worse without medication will give refill for flexeril  - cyclobenzaprine (FLEXERIL) 10 MG tablet; Take 1 tablet (10 mg total) by mouth 3 (three) times daily as needed for muscle spasms.  Dispense: 90 tablet; Refill: 0  2. Need for prophylactic vaccination and inoculation against influenza Flu vaccine given  3. Lower back pain Worse without flexeril; also cause use heat as needed - cyclobenzaprine (FLEXERIL) 10 MG tablet; Take 1 tablet (10 mg total) by mouth 3  (three) times daily as needed for muscle spasms.  Dispense: 90 tablet; Refill: 0  4. Essential hypertension, benign Will refill medications at this time and get blood work - hydrochlorothiazide (HYDRODIURIL) 25 MG tablet; Take 1 tablet (25 mg total) by mouth daily.  Dispense: 90 tablet; Refill: 1 - lisinopril (PRINIVIL,ZESTRIL) 10 MG tablet; Take 1 tablet (10 mg total) by mouth daily. Take one tablet once a day for blood pressure  Dispense: 90 tablet; Refill: 1   5. Type II or unspecified type diabetes mellitus with neurological manifestations, not stated as uncontrolled(250.60)  Will follow up blood work - Comprehensive metabolic panel - Hemoglobin A1c  6. Other and unspecified hyperlipidemia Will get fasting lipids before next appt - Lipid panel; Future   To follow up in 6 weeks to recheck blood pressure and ensure back pain has improved

## 2013-04-03 LAB — COMPREHENSIVE METABOLIC PANEL
ALT: 18 IU/L (ref 0–44)
CO2: 27 mmol/L (ref 18–29)
Calcium: 10.4 mg/dL — ABNORMAL HIGH (ref 8.7–10.2)
Chloride: 98 mmol/L (ref 97–108)
GFR calc Af Amer: 87 mL/min/{1.73_m2} (ref >59)
Glucose: 99 mg/dL (ref 65–99)
Potassium: 4.1 mmol/L (ref 3.5–5.2)
Total Bilirubin: 0.4 mg/dL (ref 0.0–1.2)
Total Protein: 7.1 g/dL (ref 6.0–8.5)

## 2013-04-03 LAB — HEMOGLOBIN A1C: Hgb A1c MFr Bld: 7 % — ABNORMAL HIGH (ref 4.8–5.6)

## 2013-05-09 ENCOUNTER — Other Ambulatory Visit: Payer: Self-pay

## 2013-05-14 ENCOUNTER — Ambulatory Visit: Payer: Self-pay | Admitting: Nurse Practitioner

## 2013-05-14 DIAGNOSIS — Z0289 Encounter for other administrative examinations: Secondary | ICD-10-CM

## 2013-06-25 ENCOUNTER — Encounter: Payer: Self-pay | Admitting: Nurse Practitioner

## 2013-06-25 ENCOUNTER — Ambulatory Visit (INDEPENDENT_AMBULATORY_CARE_PROVIDER_SITE_OTHER): Payer: Medicare Other | Admitting: Nurse Practitioner

## 2013-06-25 VITALS — BP 164/96 | HR 93 | Temp 97.7°F | Resp 18 | Wt 278.0 lb

## 2013-06-25 DIAGNOSIS — A599 Trichomoniasis, unspecified: Secondary | ICD-10-CM

## 2013-06-25 DIAGNOSIS — M5431 Sciatica, right side: Secondary | ICD-10-CM

## 2013-06-25 DIAGNOSIS — M5430 Sciatica, unspecified side: Secondary | ICD-10-CM

## 2013-06-25 DIAGNOSIS — M545 Low back pain: Secondary | ICD-10-CM

## 2013-06-25 MED ORDER — CYCLOBENZAPRINE HCL 10 MG PO TABS: 10.0000 mg | ORAL_TABLET | Freq: Three times a day (TID) | ORAL | Status: AC | PRN

## 2013-06-25 MED ORDER — METRONIDAZOLE 500 MG PO TABS: ORAL_TABLET | ORAL | Status: AC

## 2013-06-25 NOTE — Progress Notes (Signed)
Patient ID: James Fitzpatrick, male   DOB: [birth year], 57 y.o.   MRN: [id number]    No Known Allergies  Chief Complaint  Patient presents with  . Acute Visit    discuss personal issues    HPI: Patient is a 57 y.o.  seen in the office today for acute visit pts girlfriend was found positive for trichomonas; Pt does not have any symptoms but was told to come in for prophylactic treatment   Review of Systems:  Review of Systems  Constitutional: Negative for fever and chills.  Respiratory: Negative for shortness of breath.   Cardiovascular: Negative for chest pain.  Gastrointestinal: Negative for abdominal pain.  Genitourinary: Negative for dysuria, urgency and frequency.       No drainage or discharge  Musculoskeletal: Negative for myalgias.  Skin: Negative.     Past Medical History  Diagnosis Date  . Type II or unspecified type diabetes mellitus without mention of complication, uncontrolled   . Hypertension   . Depression   . Arthritis   . Hyperlipidemia   . Pain in back   . Unspecified vitamin D deficiency   . Anxiety   . Syncope and collapse   . Cough   . Unspecified urinary incontinence   . Loss of weight   . Unspecified vitamin D deficiency   . Major depressive disorder, single episode, unspecified   . Lumbago   . Loss of weight   . Type II or unspecified type diabetes mellitus without mention of complication, not stated as uncontrolled    Past Surgical History  Procedure Laterality Date  . Lumbar disc surgery  2012  . Cervical fusion  2012  . Knee arthroscopy  2012    left   Social History:   reports that he quit smoking about 8 weeks ago. His smoking use included Cigarettes. He smoked 1.00 pack per day. He has never used smokeless tobacco. He reports that he does not drink alcohol or use illicit drugs.  Family History  Problem Relation Age of Onset  . Colon cancer Father 53  . Stroke Mother     Medications: Patient's Medications  New Prescriptions   No  medications on file  Previous Medications   ACETAMINOPHEN-CODEINE (TYLENOL #3) 300-30 MG PER TABLET    Take 1 tablet by mouth every 4 (four) hours as needed for pain.   CYCLOBENZAPRINE (FLEXERIL) 10 MG TABLET    Take 1 tablet (10 mg total) by mouth 3 (three) times daily as needed for muscle spasms.   ERGOCALCIFEROL (VITAMIN D2) 50000 UNITS CAPSULE    Take 1 capsule (50,000 Units total) by mouth once a week. Take one tablet once a week for vitamin d supplement   GABAPENTIN (NEURONTIN) 100 MG CAPSULE    Take 1 capsule (100 mg total) by mouth 4 (four) times daily.   GLUCOSE BLOOD TEST STRIP    1 each by Other route as needed for other. Use to check blood sugar twice a day Dx. 250.02   HYDROCHLOROTHIAZIDE (HYDRODIURIL) 25 MG TABLET    Take 1 tablet (25 mg total) by mouth daily.   LISINOPRIL (PRINIVIL,ZESTRIL) 10 MG TABLET    Take 1 tablet (10 mg total) by mouth daily. Take one tablet once a day for blood pressure   METFORMIN (GLUCOPHAGE) 1000 MG TABLET    Take 1 tablet (1,000 mg total) by mouth 2 (two) times daily with a meal.   MULTIPLE VITAMINS-MINERALS (CENTRUM SILVER PO)    Take by mouth. Take one  tablet once a day  Modified Medications   No medications on file  Discontinued Medications   No medications on file     Physical Exam:  Filed Vitals:   06/25/13 1638  BP: 164/96  Pulse: 93  Temp: 97.7 F (36.5 C)  TempSrc: Oral  Resp: 18  Weight: 278 lb (126.1 kg)  SpO2: 96%    Physical Exam  Vitals reviewed. Constitutional: He is well-developed, well-nourished, and in no distress.  HENT:  Head: Normocephalic and atraumatic.  Cardiovascular: Normal rate, regular rhythm and normal heart sounds.   Pulmonary/Chest: Effort normal.  Abdominal: Soft. Bowel sounds are normal.  Neurological: He is alert.  Skin: Skin is warm and dry.  Psychiatric: Affect normal.     Labs reviewed: Basic Metabolic Panel:  Recent Labs  16/10/96 1018 12/24/12 1536 04/02/13 1714  NA 136 139 140  K  4.4 3.9 4.1  CL 97 100 98  CO2 24 23 27   GLUCOSE 105* 113* 99  BUN 15 11 13   CREATININE 1.29* 1.33* 1.10  CALCIUM 10.5* 10.5* 10.4*   Liver Function Tests:  Recent Labs  12/24/12 1536 04/02/13 1714  AST 25 14  ALT 29 18  ALKPHOS 72 76  BILITOT 0.5 0.4  PROT 7.6 7.1   No results found for this basename: LIPASE, AMYLASE,  in the last 8760 hours No results found for this basename: AMMONIA,  in the last 8760 hours CBC: No results found for this basename: WBC, NEUTROABS, HGB, HCT, MCV, PLT,  in the last 8760 hours Lipid Panel:  Recent Labs  12/24/12 1536  HDL 43  LDLCALC 158*  TRIG 94  CHOLHDL 5.1*    Assessment/Plan 1. Sciatica, right and Lower back pain -better now that he is not work but about to go back to work more frequently  - provided refill for cyclobenzaprine (FLEXERIL) 10 MG tablet; Take 1 tablet (10 mg total) by mouth 3 (three) times daily as needed for muscle spasms.  Dispense: 90 tablet; Refill: 0  2. Trichomonal infection - metroNIDAZOLE (FLAGYL) 500 MG tablet; Take 4 tablets to equal 2000 mg times 1  Dispense: 4 tablet; Refill: 0 -educated on how trichomonas is spread -no intercourse until both partners are treated and for 7 days after last dose given -no alcohol intake while taking medication

## 2014-03-09 ENCOUNTER — Encounter: Payer: Self-pay | Admitting: Nurse Practitioner

## 2014-03-09 ENCOUNTER — Telehealth: Payer: Self-pay | Admitting: Nurse Practitioner

## 2014-10-21 NOTE — Telephone Encounter (Signed)
Opened in error

## 2014-12-07 IMAGING — CR DG LUMBAR SPINE COMPLETE 4+V
5 series · 5 of 5 positions shown · non-contrast
Comparison: None.

CLINICAL DATA: Left-sided low back pain and left hip pain.

LUMBAR SPINE - COMPLETE 4+ VIEW

[t l-spine a.p.]
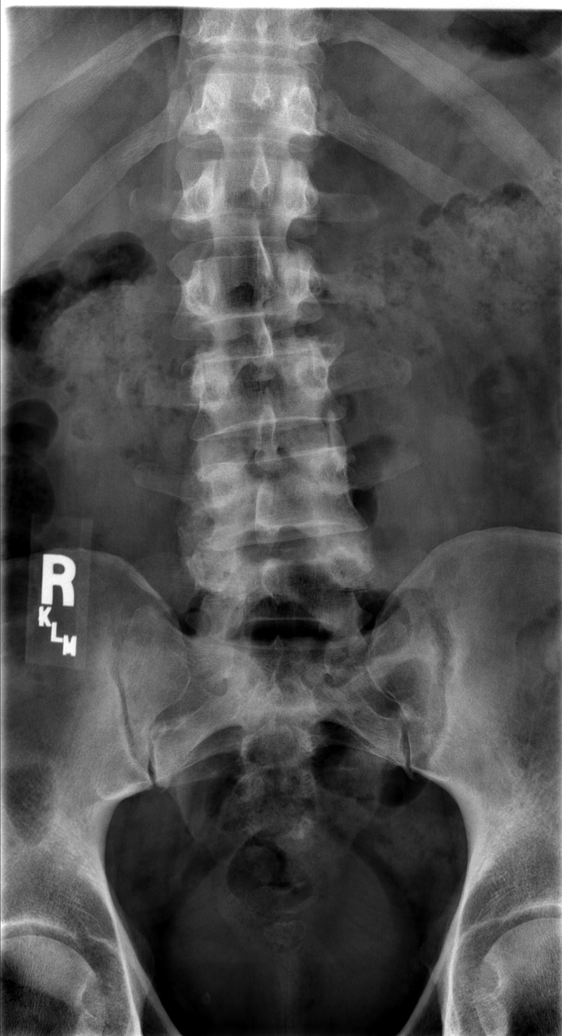

[t l-spine oblique exposure (1 of 2)]
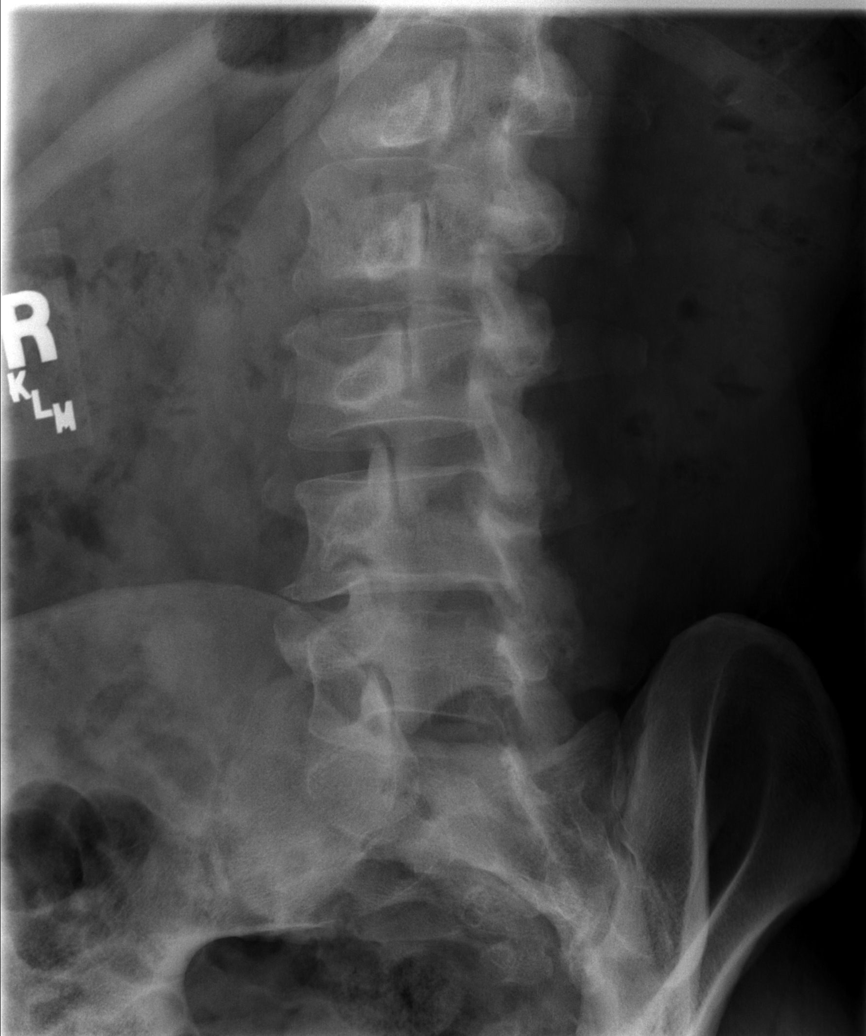

[t l-spine oblique exposure (2 of 2)]
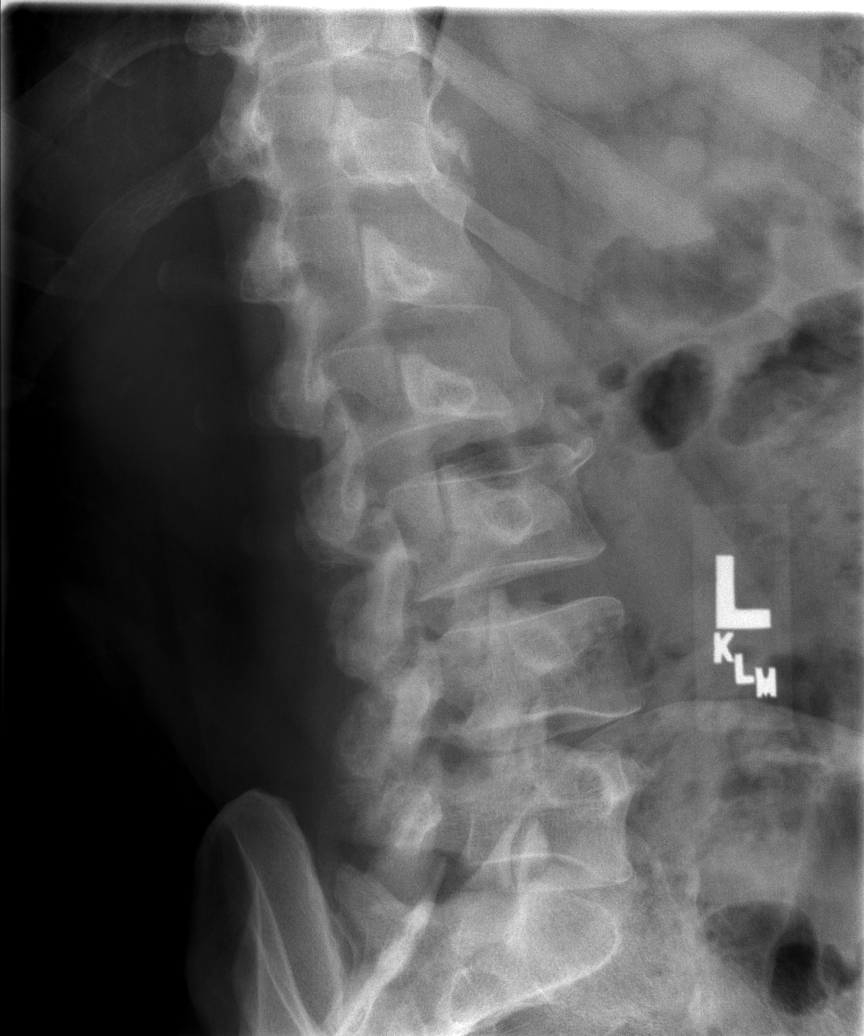

[t l-spine lat]
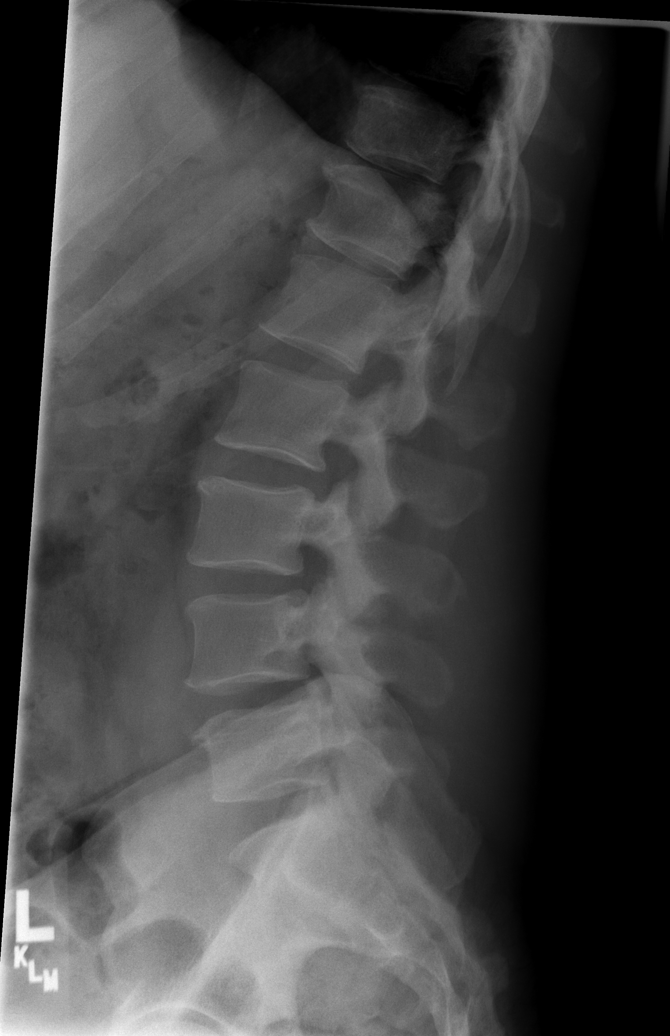

[t l-spine l5-s1 spot]
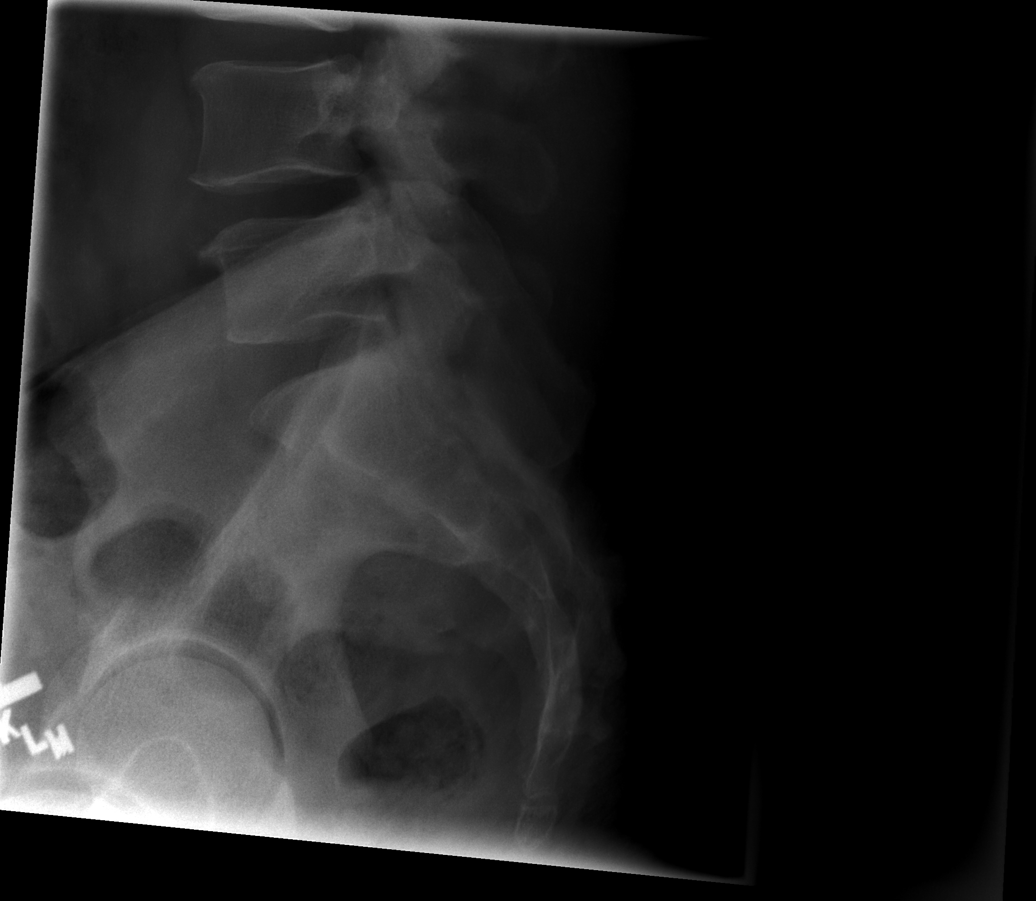

[5 of 5 positions shown; findings below may reference images not displayed]

FINDINGS: Slight facet arthritis at L3-4 and L4-5 on the right at
L4-5 on the left.  2 mm spondylolisthesis of L4-L5.  No disc space
narrowing.

No fractures or bone destruction.
IMPRESSION: Degenerative facet arthritis at L3-4 and L4-5.  Minimal
spondylolisthesis of L4 on L5 without disc space narrowing.

## 2014-12-07 IMAGING — US US AORTA SCREENING (MEDICARE)
1 series · 11 of 11 positions shown · non-contrast
Comparison: Lumbar spine films of 09/10/2012

CLINICAL DATA: Smoking history, screening for abdominal aortic
aneurysm

ULTRASOUND OF ABDOMINAL AORTA
TECHNIQUE: Ultrasound examination of the abdominal aorta was
performed to evaluate for abdominal aortic aneurysm.

[Series 1: us aorta screening (medicare) · 0.34mm/px · 11 of 11 slices shown]
[im 1/11]
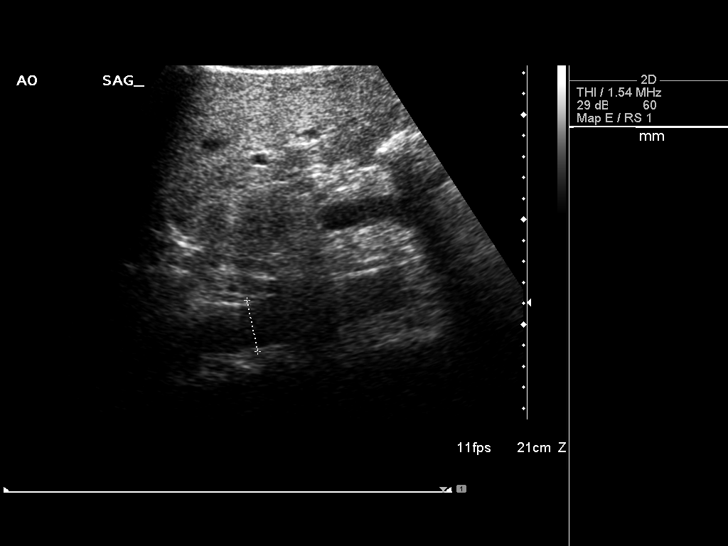
[im 2/11]
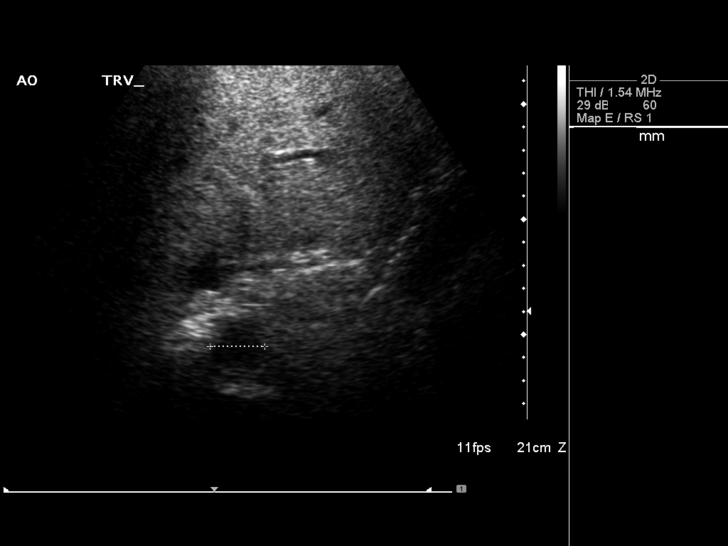
[im 3/11]
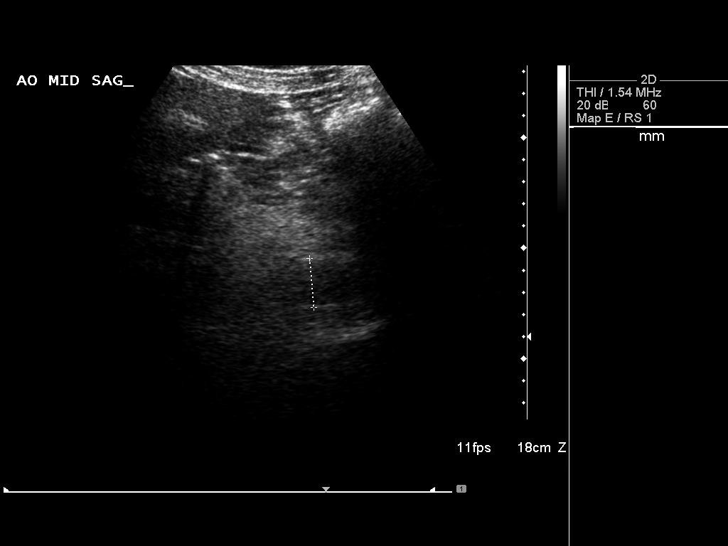
[im 4/11]
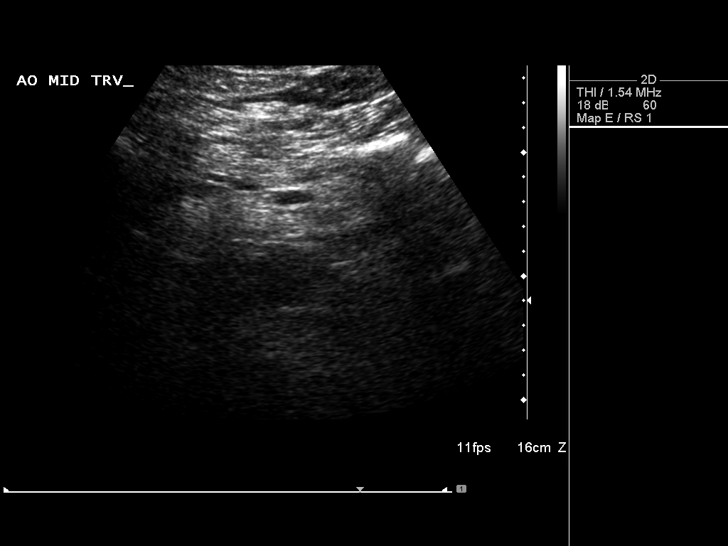
[im 5/11]
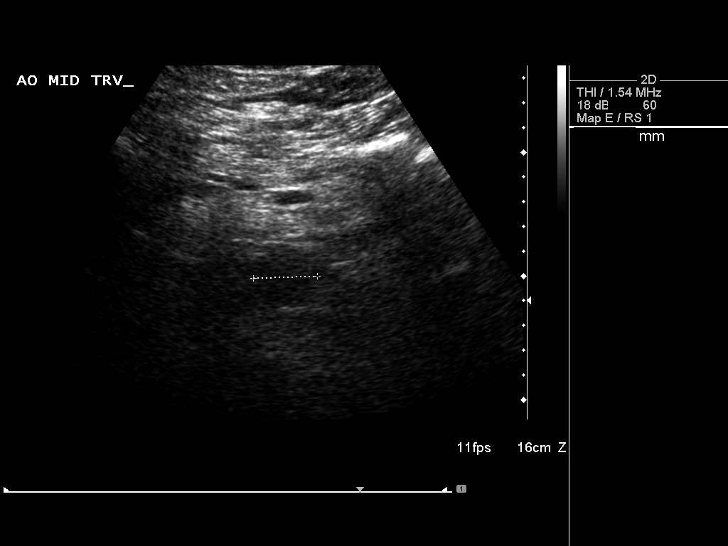
[im 6/11]
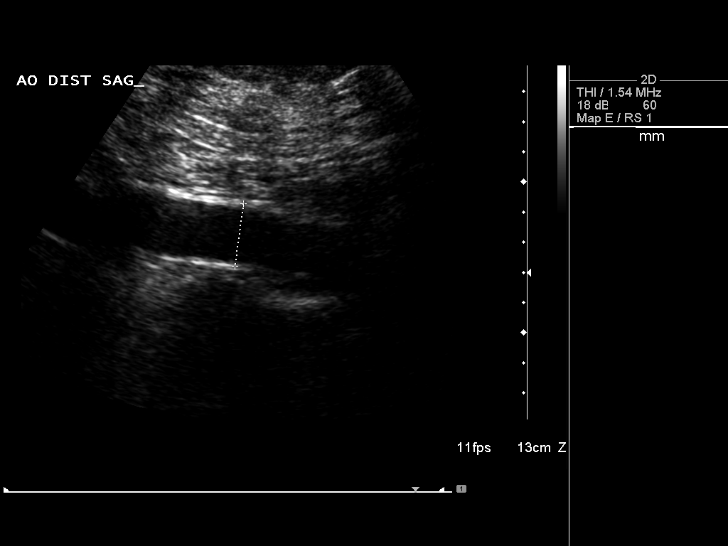
[im 7/11]
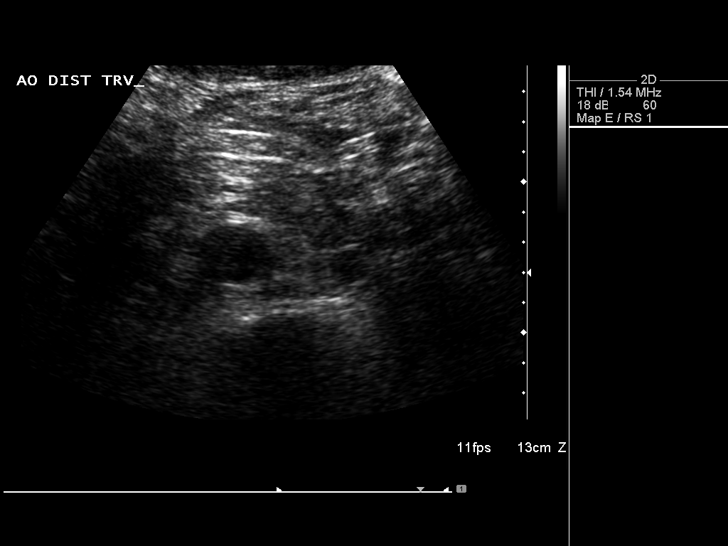
[im 8/11]
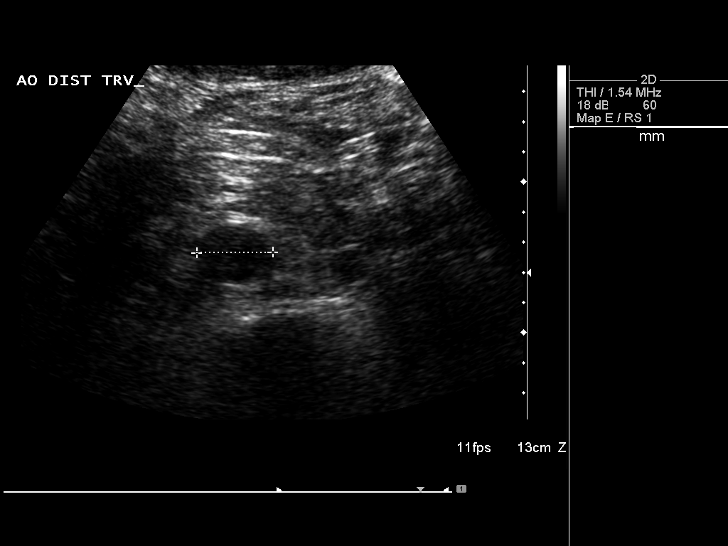
[im 9/11]
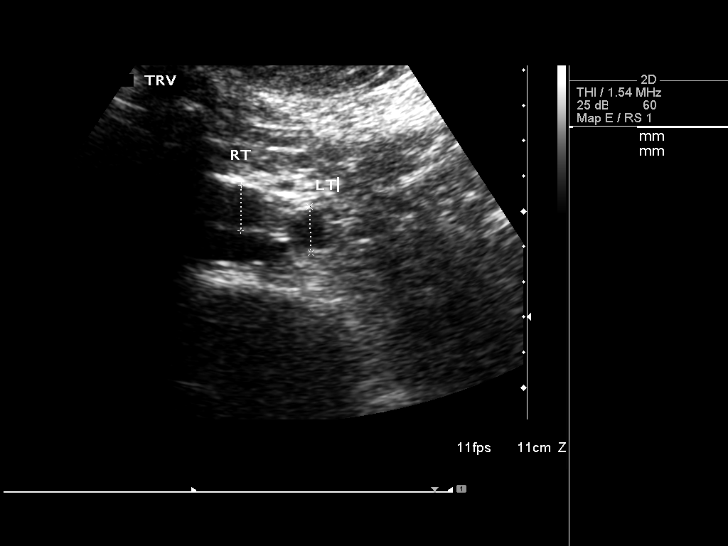
[im 10/11]
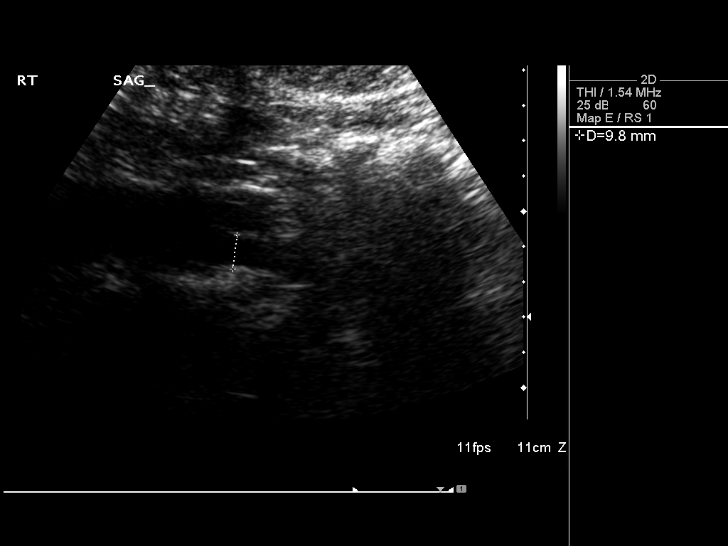
[im 11/11]
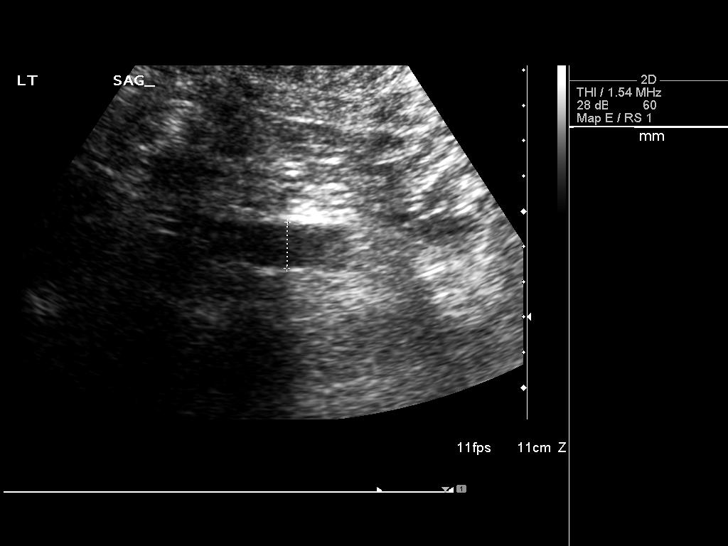

[11 of 11 positions shown; findings below may reference images not displayed]

Abdominal Aorta:  No abdominal aortic aneurysm is seen.  The
maximum diameter of the abdominal aorta is 2.5 x 2.6 cm proximally.

      Maximum AP diameter:  2.5 cm.
      Maximum TRV diameter:   2.6 cm.

The right common iliac artery measures 1.3 cm in diameter
proximally, as does the left common iliac artery.
IMPRESSION: No abdominal aortic aneurysm identified.

## 2017-10-04 ENCOUNTER — Encounter: Payer: Self-pay | Admitting: *Deleted

## 2017-10-11 ENCOUNTER — Encounter: Payer: Self-pay | Admitting: Internal Medicine
